# Patient Record
Sex: Male | Born: 1970 | Race: Black or African American | Hispanic: No | Smoking: Current every day smoker
Health system: Southern US, Community
[De-identification: ages and names within clinical notes are randomized; demographics above are authoritative.]

## PROBLEM LIST (undated history)

## (undated) HISTORY — PX: FINGER SURGERY: SHX640

---

## 2012-07-18 ENCOUNTER — Encounter (HOSPITAL_COMMUNITY): Payer: Self-pay | Admitting: Emergency Medicine

## 2012-07-18 ENCOUNTER — Emergency Department (HOSPITAL_COMMUNITY)
Admission: EM | Admit: 2012-07-18 | Discharge: 2012-07-18 | Disposition: A | Discharge status: Home / Self Care | Payer: Self-pay | Attending: Emergency Medicine | Admitting: Emergency Medicine

## 2012-07-18 DIAGNOSIS — F17200 Nicotine dependence, unspecified, uncomplicated: Secondary | ICD-10-CM | POA: Insufficient documentation

## 2012-07-18 DIAGNOSIS — L0231 Cutaneous abscess of buttock: Secondary | ICD-10-CM | POA: Insufficient documentation

## 2012-07-18 DIAGNOSIS — L03317 Cellulitis of buttock: Secondary | ICD-10-CM | POA: Insufficient documentation

## 2012-07-18 MED ORDER — HYDROCODONE-ACETAMINOPHEN 5-325 MG PO TABS: 1.0000 | ORAL_TABLET | Freq: Four times a day (QID) | ORAL | Status: DC | PRN

## 2012-07-18 MED ORDER — CLINDAMYCIN HCL 150 MG PO CAPS: 300.0000 mg | ORAL_CAPSULE | Freq: Three times a day (TID) | ORAL | Status: DC

## 2012-07-18 NOTE — ED Notes (Signed)
Patient complains of L butt cheek boil.  Patient advised it has been draining off and on.

## 2012-07-18 NOTE — ED Notes (Signed)
Abscess L butt cheek.

## 2012-07-18 NOTE — ED Provider Notes (Signed)
History     CSN: [id number]  Arrival date & time 07/18/12  1046   First MD Initiated Contact with Patient 07/18/12 1059      Chief Complaint  Patient presents with  . Abscess    L butt cheek    (Consider location/radiation/quality/duration/timing/severity/associated sxs/prior treatment) HPI Mitchell Mccarty is a 42 y.o. male who presents to ED with complaint of an abscess to the left buttock. States started about 3 days ago. Draining "some." tried warm compresses and baths with no relief. States hx of the same in the same location. No fever, chills, malaise. No other complaints.  History reviewed. No pertinent past medical history.  History reviewed. No pertinent past surgical history.  No family history on file.  History  Substance Use Topics  . Smoking status: Current Every Day Smoker -- 1.00 packs/day    Types: Cigarettes  . Smokeless tobacco: Not on file  . Alcohol Use: Yes      Review of Systems  Constitutional: Negative for fever and chills.  Skin: Positive for wound.    Allergies  Codeine and Other  Home Medications   Current Outpatient Rx  Name  Route  Sig  Dispense  Refill  . ibuprofen (ADVIL,MOTRIN) 200 MG tablet   Oral   Take 800 mg by mouth every 6 (six) hours as needed for pain.           BP 131/87  Pulse 71  Temp(Src) 97.5 F (36.4 C) (Oral)  Resp 20  Ht 5\' 9"  (1.753 m)  Wt 210 lb (95.255 kg)  BMI 31 kg/m2  SpO2 95%  Physical Exam  Nursing note and vitals reviewed. Constitutional: He appears well-developed and well-nourished. No distress.  Cardiovascular: Normal rate, regular rhythm and normal heart sounds.   Pulmonary/Chest: Effort normal and breath sounds normal. No respiratory distress. He has no wheezes. He has no rales.  Skin: Skin is warm and dry.  3x3 cm abscess to the left buttock just lateral to the rectum. Tender. No drainage at present.     ED Course  Procedures (including critical care time)  INCISION AND  DRAINAGE Performed by: Jaynie Crumble A Consent: Verbal consent obtained. Risks and benefits: risks, benefits and alternatives were discussed Type: abscess  Body area: left buttock  Anesthesia: local infiltration  Incision was made with a scalpel.  Local anesthetic: lidocaine 2% w/ epinephrine  Anesthetic total: 5 ml  Complexity: complex Blunt dissection to break up loculations  Drainage: purulent  Drainage amount: large  Packing material: 1/4 in iodoform gauze  Patient tolerance: Patient tolerated the procedure well with no immediate complications.     1. Abscess of left buttock       MDM  Abscess I&Ded, packed. Will start on antibiotics, and follow up in 2 days for recheck. Discussed with pt other possibilities of recurrent abscess in the same location so close to the rectum. Will have pt follow up with general surgery if recurs. Pt otherwise afebrile. Non toxic.   Filed Vitals:   07/18/12 1105  BP: 131/87  Pulse: 71  Temp: 97.5 F (36.4 C)  Resp: 20               Tatyana A Kirichenko, PA-C 07/18/12 1623

## 2012-07-20 ENCOUNTER — Emergency Department (HOSPITAL_COMMUNITY)
Admission: EM | Admit: 2012-07-20 | Discharge: 2012-07-20 | Disposition: A | Discharge status: Home / Self Care | Payer: Self-pay | Attending: Emergency Medicine | Admitting: Emergency Medicine

## 2012-07-20 ENCOUNTER — Encounter (HOSPITAL_COMMUNITY): Payer: Self-pay | Admitting: Nurse Practitioner

## 2012-07-20 DIAGNOSIS — L03317 Cellulitis of buttock: Secondary | ICD-10-CM | POA: Insufficient documentation

## 2012-07-20 DIAGNOSIS — L0231 Cutaneous abscess of buttock: Secondary | ICD-10-CM | POA: Insufficient documentation

## 2012-07-20 DIAGNOSIS — Z4800 Encounter for change or removal of nonsurgical wound dressing: Secondary | ICD-10-CM | POA: Insufficient documentation

## 2012-07-20 DIAGNOSIS — F17200 Nicotine dependence, unspecified, uncomplicated: Secondary | ICD-10-CM | POA: Insufficient documentation

## 2012-07-20 NOTE — ED Provider Notes (Signed)
History    This chart was scribed for non-physician practitioner working with Glynn Octave, MD by Frederik Pear, ED Scribe. This patient was seen in room TR10C/TR10C and the patient's care was started at 1632.   CSN: [id number]  Arrival date & time 07/20/12  1613   First MD Initiated Contact with Patient 07/20/12 1632      Chief Complaint  Patient presents with  . Follow-up    (Consider location/radiation/quality/duration/timing/severity/associated sxs/prior treatment) Patient is a 42 y.o. male presenting with abscess. The history is provided by the patient. No language interpreter was used.  Abscess Location:  Ano-genital Ano-genital abscess location:  R buttock and L buttock Abscess quality: draining   Red streaking: no   Chronicity:  Recurrent Associated symptoms: no fatigue, no fever, no headaches, no nausea and no vomiting   Risk factors: prior abscess     Mitchell Mccarty is a 42 y.o. male who presents to the Emergency Department complaining of a follow up to remove the packing from and I&D performed 2 days ago for an abscess to the right buttock. The abscess as has been recurrent in the past, but he has never seen a Development worker, international aid.  He denies any nocturnal hidrosis, fever, chills, nausea, or emesis.  Pt has been taking his antibiotics as directed.    History reviewed. No pertinent past medical history.  Past Surgical History  Procedure Laterality Date  . Finger surgery      No family history on file.  History  Substance Use Topics  . Smoking status: Current Every Day Smoker -- 1.00 packs/day    Types: Cigarettes  . Smokeless tobacco: Not on file  . Alcohol Use: Yes      Review of Systems  Constitutional: Negative for fever, diaphoresis, appetite change, fatigue and unexpected weight change.  HENT: Negative for mouth sores and neck stiffness.   Eyes: Negative for visual disturbance.  Respiratory: Negative for cough, chest tightness, shortness of breath and  wheezing.   Cardiovascular: Negative for chest pain.  Gastrointestinal: Negative for nausea, vomiting, abdominal pain, diarrhea and constipation.  Endocrine: Negative for polydipsia, polyphagia and polyuria.  Genitourinary: Negative for dysuria, urgency, frequency and hematuria.  Musculoskeletal: Negative for back pain.  Skin: Negative for rash.       Abscess  Allergic/Immunologic: Negative for immunocompromised state.  Neurological: Negative for syncope, light-headedness and headaches.  Hematological: Does not bruise/bleed easily.  Psychiatric/Behavioral: Negative for sleep disturbance. The patient is not nervous/anxious.   All other systems reviewed and are negative.    Allergies  Codeine and Other  Home Medications   Current Outpatient Rx  Name  Route  Sig  Dispense  Refill  . ibuprofen (ADVIL,MOTRIN) 200 MG tablet   Oral   Take 800 mg by mouth every 6 (six) hours as needed for pain.           BP 122/82  Pulse 64  Temp(Src) 98.3 F (36.8 C) (Oral)  Resp 16  SpO2 97%  Physical Exam  Nursing note and vitals reviewed. Constitutional: He is oriented to person, place, and time. He appears well-developed and well-nourished. No distress.  HENT:  Head: Normocephalic and atraumatic.  Eyes: Conjunctivae are normal. No scleral icterus.  Neck: Normal range of motion.  Cardiovascular: Normal rate, regular rhythm and intact distal pulses.   Pulmonary/Chest: Effort normal and breath sounds normal.  Abdominal: Soft. He exhibits no distension. There is no tenderness.  Genitourinary:  Left buttock inside the gluteal cleft there is a 1  cm x1 cm erythematous site with obvious I&D location with packing in place. No palpable induration. Moderate purulent drainage.   Lymphadenopathy:    He has no cervical adenopathy.  Neurological: He is alert and oriented to person, place, and time.  Skin: Skin is warm and dry. He is not diaphoretic. There is erythema.  Psychiatric: He has a normal  mood and affect.    ED Course  Procedures (including critical care time)  DIAGNOSTIC STUDIES: Oxygen Saturation is 97% on room air, adequate by my interpretation.    COORDINATION OF CARE:  17:05- Discussed planned course of treatment with the patient, including warm water soaks, who is agreeable at this time.  Labs Reviewed - No data to display No results found.   1. Abscess packing removal       MDM  Mitchell Mccarty presents for wound check and packing removal.  Pt with very small amount of packing.  Wound appears to be healing as compared to documented last note.  Pt given wound care instructions and general surgery follow-up as needed.  I have also discussed reasons to return immediately to the ER.  Patient expresses understanding and agrees with plan.   1. Medications: usual home medications 2. Treatment: rest, drink plenty of fluids, warm sitz bath for continued wound drainage 3. Follow Up: Please followup with your primary doctor for discussion of your diagnoses and further evaluation after today's visit; if you do not have a primary care doctor use the resource guide provided to find one; follow-up with general surgery as needed  I personally performed the services described in this documentation, which was scribed in my presence. The recorded information has been reviewed and is accurate.       Dahlia Client Muthersbaugh, PA-C 07/20/12 1722

## 2012-07-20 NOTE — ED Provider Notes (Signed)
Medical screening examination/treatment/procedure(s) were performed by non-physician practitioner and as supervising physician I was immediately available for consultation/collaboration.    Stephen Rancour, MD 07/20/12 2206 

## 2012-07-20 NOTE — ED Notes (Signed)
Pt seen here Wednesday for abscess drainage. Follow up for packing removal today

## 2012-07-23 NOTE — ED Provider Notes (Signed)
Medical screening examination/treatment/procedure(s) were performed by non-physician practitioner and as supervising physician I was immediately available for consultation/collaboration.   Demetres Prochnow E Yussef Jorge, MD 07/23/12 0740 

## 2012-09-20 ENCOUNTER — Emergency Department (HOSPITAL_COMMUNITY)
Admission: EM | Admit: 2012-09-20 | Discharge: 2012-09-20 | Disposition: A | Discharge status: Home / Self Care | Payer: Self-pay | Attending: Emergency Medicine | Admitting: Emergency Medicine

## 2012-09-20 ENCOUNTER — Emergency Department (HOSPITAL_COMMUNITY): Payer: Self-pay

## 2012-09-20 ENCOUNTER — Encounter (HOSPITAL_COMMUNITY): Payer: Self-pay | Admitting: Nurse Practitioner

## 2012-09-20 DIAGNOSIS — Y9383 Activity, rough housing and horseplay: Secondary | ICD-10-CM | POA: Insufficient documentation

## 2012-09-20 DIAGNOSIS — X500XXA Overexertion from strenuous movement or load, initial encounter: Secondary | ICD-10-CM | POA: Insufficient documentation

## 2012-09-20 DIAGNOSIS — S298XXA Other specified injuries of thorax, initial encounter: Secondary | ICD-10-CM | POA: Insufficient documentation

## 2012-09-20 DIAGNOSIS — Y9289 Other specified places as the place of occurrence of the external cause: Secondary | ICD-10-CM | POA: Insufficient documentation

## 2012-09-20 DIAGNOSIS — R0781 Pleurodynia: Secondary | ICD-10-CM

## 2012-09-20 DIAGNOSIS — F17200 Nicotine dependence, unspecified, uncomplicated: Secondary | ICD-10-CM | POA: Insufficient documentation

## 2012-09-20 MED ORDER — IBUPROFEN 800 MG PO TABS: 800.0000 mg | ORAL_TABLET | Freq: Three times a day (TID) | ORAL | Status: AC

## 2012-09-20 MED ORDER — HYDROCODONE-ACETAMINOPHEN 5-325 MG PO TABS: ORAL_TABLET | ORAL | Status: AC

## 2012-09-20 NOTE — ED Notes (Signed)
States he was horseplaying last week and has been having R sided rib pain since. Denies SOB but it hurts worse to breathe. A&Ox4, resp e/u

## 2012-09-20 NOTE — ED Provider Notes (Signed)
History    This chart was scribed for non-physician practitioner [name] working with Mitchell Munch, MD by Quintella Reichert, ED Scribe. This patient was seen in room TR05C/TR05C and the patient's care was started at 4:38 PM .   CSN: [id number]  Arrival date & time 09/20/12  1349      Chief Complaint  Patient presents with  . Rib Injury     The history is provided by the patient. No language interpreter was used.   HPI Comments: Mitchell Mccarty is a 42 y.o. male who presents to the Emergency Department complaining of constant left rib pain subsequent to an injury sustained when pt was wrestling 1 week ago.  Pt reports he does not remember exactly how the injury occurred.  He rates severity of pain as 10/10 at its worst, and states it is exacerbated by deep inspiration and by movement, especially by tilting his torso to the right.  Pt denies visible bruising to the area.  He denies pain or injury in any other area.  An X-ray taken on admission was negative for fractures.  Pt has been treating pain with ice and heat.  Pt denies fever, chills, CP, cough, SOB, abdominal pain, nausea, emesis, diarrhea, urinary symptoms, back pain, HA, weakness, numbness or any other associated symptoms.  He has no h/o asthma or any other medical problems.  Pt denies regular heavy physical activity.  Pt has no PCP. Pt is allergic to codeine.    No past medical history on file.  Past Surgical History  Procedure Laterality Date  . Finger surgery      No family history on file.  History  Substance Use Topics  . Smoking status: Current Every Day Smoker -- 1.00 packs/day    Types: Cigarettes  . Smokeless tobacco: Not on file  . Alcohol Use: Yes      Review of Systems  Constitutional: Negative for fever and chills.  Respiratory: Negative for cough and shortness of breath.   Cardiovascular: Negative for chest pain.  Gastrointestinal: Negative for nausea, vomiting, abdominal pain and  diarrhea.  Genitourinary: Negative for dysuria and difficulty urinating.  Musculoskeletal: Negative for back pain.  Neurological: Negative for weakness, numbness and headaches.  All other systems reviewed and are negative.    Allergies  Codeine and Other  Home Medications   Current Outpatient Rx  Name  Route  Sig  Dispense  Refill  . HYDROcodone-acetaminophen (NORCO/VICODIN) 5-325 MG per tablet      Take 1-2 tablets every 6 hours as needed for pain   6 tablet   0   . ibuprofen (ADVIL,MOTRIN) 800 MG tablet   Oral   Take 1 tablet (800 mg total) by mouth 3 (three) times daily.   21 tablet   0     BP 142/75  Pulse 83  Temp(Src) 98.3 F (36.8 C) (Oral)  Resp 18  SpO2 97%  Physical Exam  Nursing note and vitals reviewed. Constitutional: He is oriented to person, place, and time. He appears well-developed and well-nourished. No distress.  HENT:  Head: Normocephalic and atraumatic.  Eyes: EOM are normal.  Neck: Normal range of motion. Neck supple. No tracheal deviation present.  Cardiovascular: Normal rate and regular rhythm.   Pulmonary/Chest: Effort normal and breath sounds normal. No respiratory distress. He has no wheezes. He has no rales. He exhibits tenderness (Right side).  Abdominal: Soft. He exhibits no distension. There is no tenderness.  Musculoskeletal: Normal range of motion. He exhibits tenderness (Right  chest wall).  Right chest wall without tenderness, crepitus, or flail chest.  Neurological: He is alert and oriented to person, place, and time.  Skin: Skin is warm and dry.  Intact, no ecchymosis  Psychiatric: He has a normal mood and affect. His behavior is normal.    ED Course  Procedures (including critical care time)  DIAGNOSTIC STUDIES: Oxygen Saturation is 97% on room air, normal by my interpretation.    COORDINATION OF CARE: 4:42 PM-Discussed treatment plan with pt at bedside, which includes pain medication, rest, and f/u with PCP if symptoms  do not begin to improve within 2 weeks, or if new symptoms develop. Pt agreed to plan.      Labs Reviewed - No data to display Dg Chest 2 View  09/20/2012  *RADIOLOGY REPORT*  Clinical Data: Chest pain.  CHEST - 2 VIEW  Comparison:  None.  Findings:  The heart size and mediastinal contours are within normal limits.  Both lungs are clear.  The visualized skeletal structures are unremarkable.  IMPRESSION: No active cardiopulmonary disease.   Original Report Authenticated By: Richarda Overlie, M.D.      1. Rib pain on right side       MDM  Pt c/o right sided rib pain after "horseplaying" last week.  States pain has gotten a little better but still hurts with deep breathing and laying on right side.  Denies hitting head or other injuries at this time.  Plain film: no visible fx.  Advised pt it is possible he might have a tiny fx not visible at this time or he could have simply bruised his rib.  Explained that pain control is tx goal at this time along with encouraging him to breath normally.  Try not to take shallow breaths.   Rx: norco and ibuprofen. Gave contact info for [employer] to f/u with PCP if symptoms do not begin to improve within 2 weeks or if develop cough with fever.   I personally performed the services described in this documentation, which was scribed in my presence. The recorded information has been reviewed and is accurate.      [name], PA-C 09/21/12 1717

## 2012-09-21 NOTE — ED Provider Notes (Signed)
.  Sheran Fava, MD 09/21/12 [phone]

## 2014-03-28 IMAGING — CR DG CHEST 2V
2 series · 2 of 2 positions shown · non-contrast
Comparison: None.

CLINICAL DATA: Chest pain.

CHEST - 2 VIEW

[w chest pa]
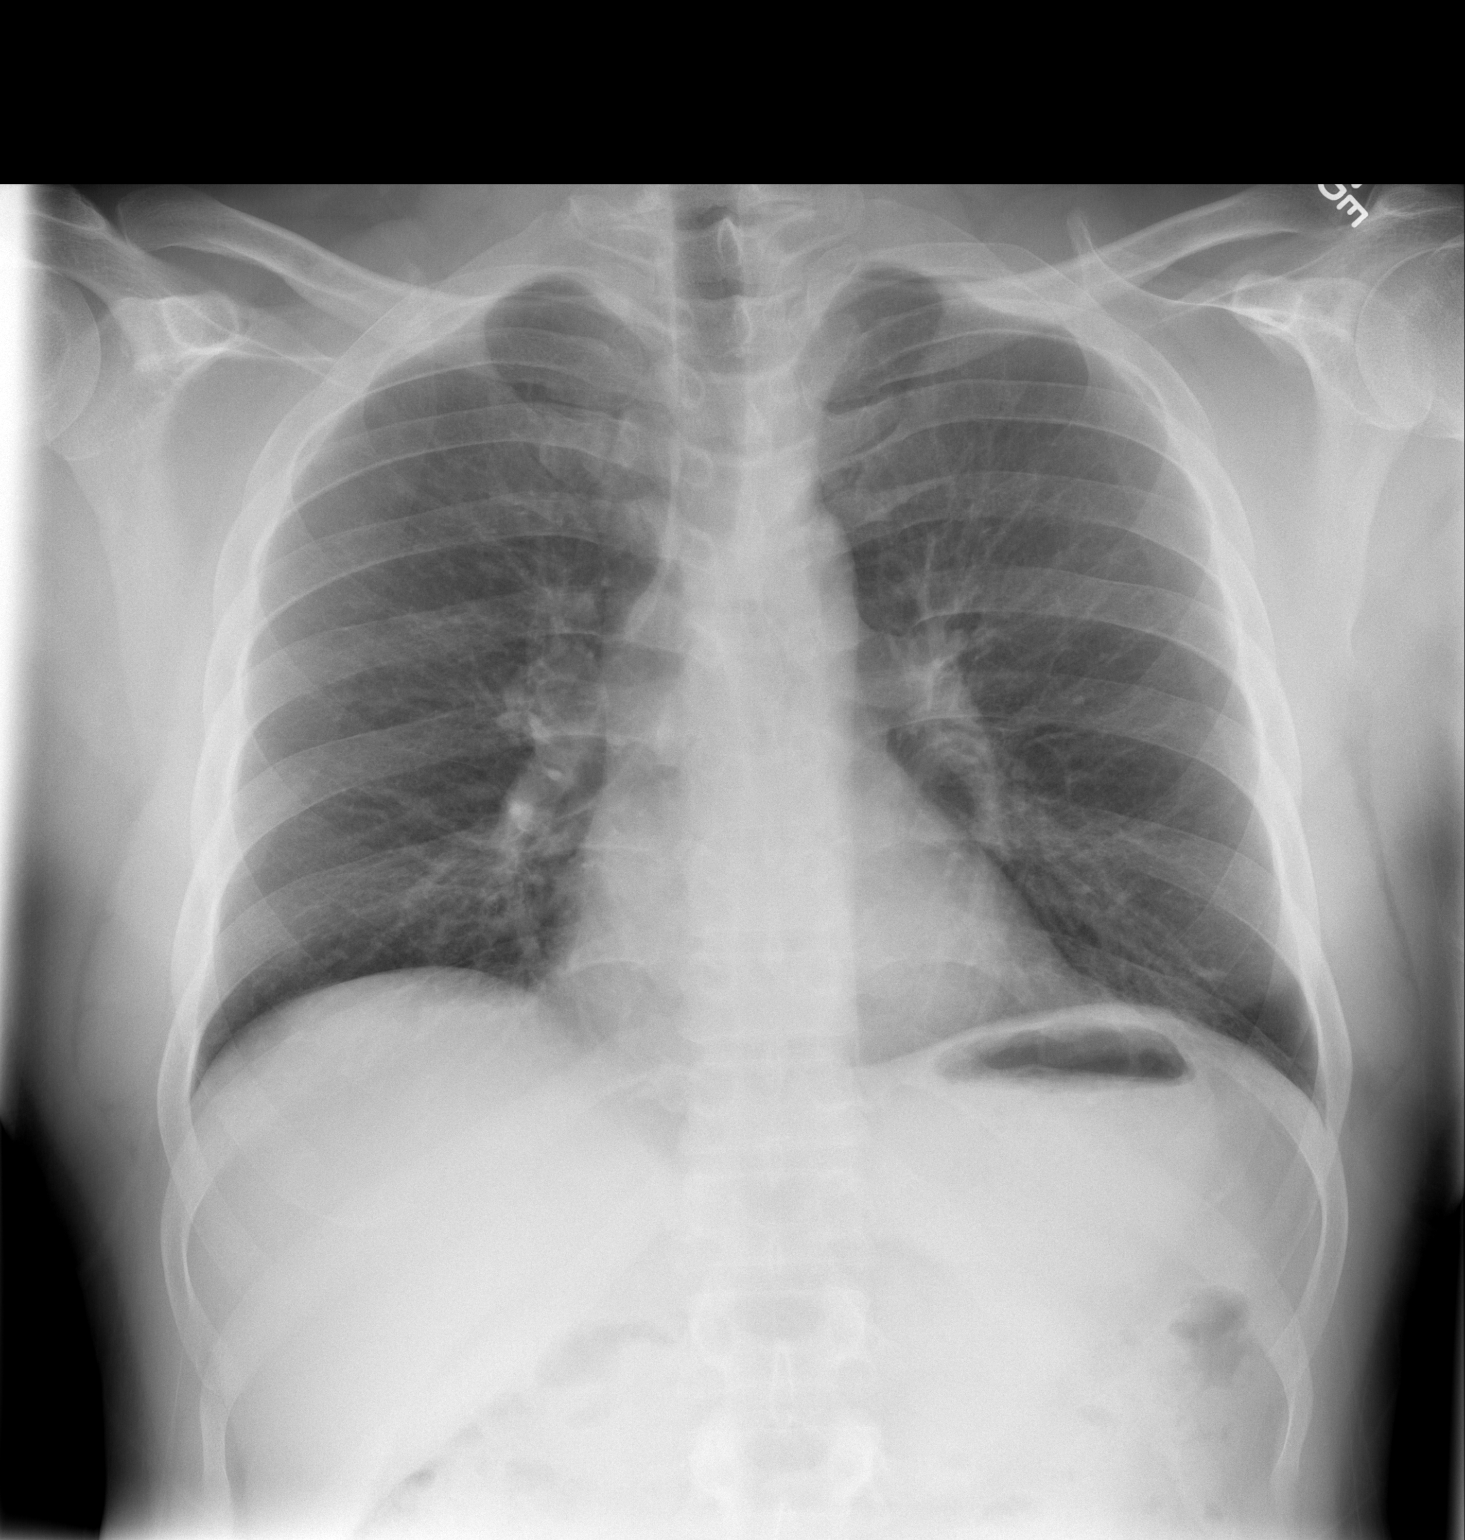

[w chest lat]
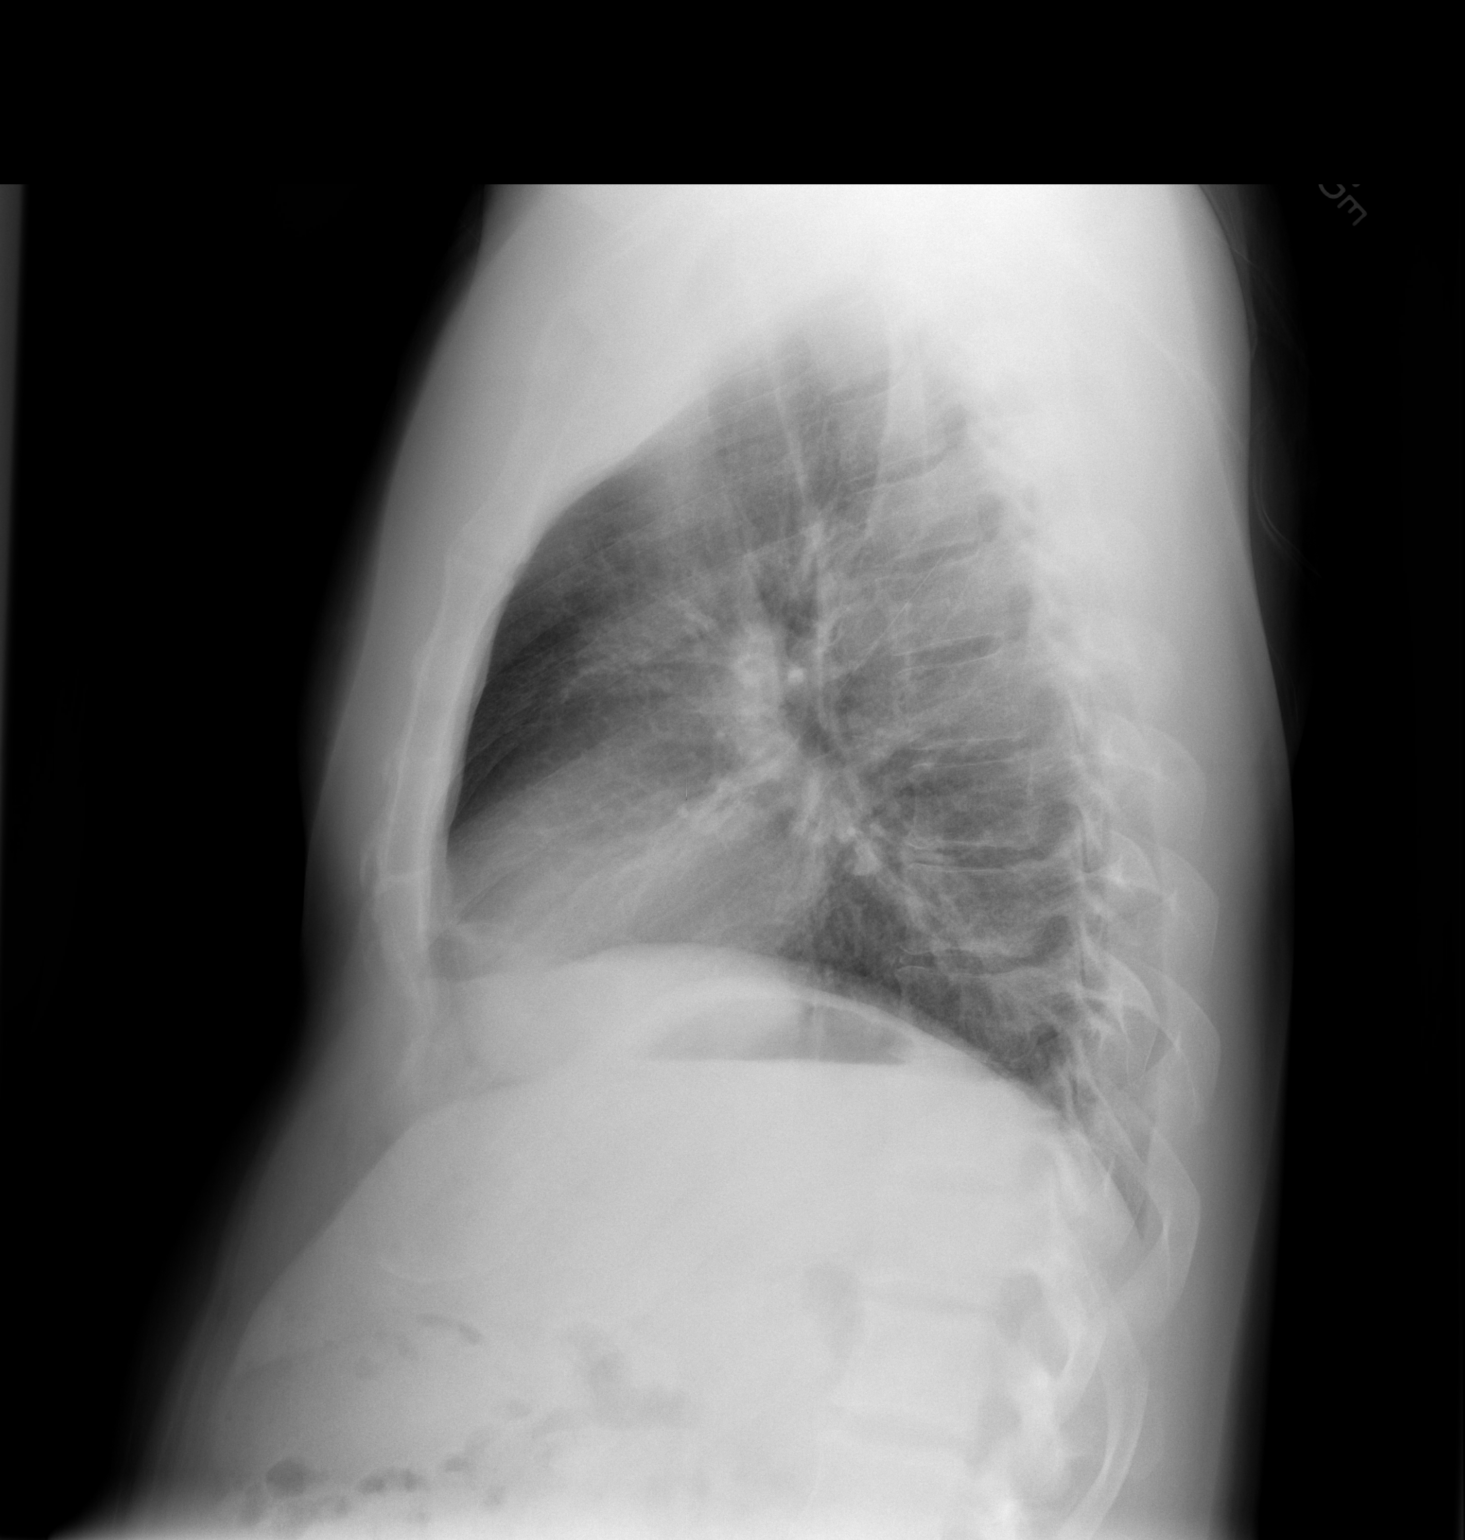

[2 of 2 positions shown; findings below may reference images not displayed]

FINDINGS: The heart size and mediastinal contours are within
normal limits.  Both lungs are clear.  The visualized skeletal
structures are unremarkable.
IMPRESSION: No active cardiopulmonary disease.

## 2016-12-04 ENCOUNTER — Emergency Department (HOSPITAL_COMMUNITY)
Admission: EM | Admit: 2016-12-04 | Discharge: 2016-12-04 | Disposition: A | Discharge status: Home / Self Care | Payer: BLUE CROSS/BLUE SHIELD | Attending: Emergency Medicine | Admitting: Emergency Medicine

## 2016-12-04 ENCOUNTER — Encounter (HOSPITAL_COMMUNITY): Payer: Self-pay | Admitting: Emergency Medicine

## 2016-12-04 ENCOUNTER — Emergency Department (HOSPITAL_COMMUNITY): Payer: BLUE CROSS/BLUE SHIELD

## 2016-12-04 DIAGNOSIS — Z79899 Other long term (current) drug therapy: Secondary | ICD-10-CM | POA: Insufficient documentation

## 2016-12-04 DIAGNOSIS — M25561 Pain in right knee: Secondary | ICD-10-CM | POA: Diagnosis present

## 2016-12-04 DIAGNOSIS — Z791 Long term (current) use of non-steroidal anti-inflammatories (NSAID): Secondary | ICD-10-CM | POA: Diagnosis not present

## 2016-12-04 DIAGNOSIS — F17210 Nicotine dependence, cigarettes, uncomplicated: Secondary | ICD-10-CM | POA: Insufficient documentation

## 2016-12-04 MED ORDER — MELOXICAM 15 MG PO TABS: 15.0000 mg | ORAL_TABLET | Freq: Every day | ORAL | 0 refills | Status: AC

## 2016-12-04 NOTE — ED Provider Notes (Signed)
[facility]-EMERGENCY DEPT Provider Note   CSN: [id number] Arrival date & time: 12/04/16  0907  By signing my name below, I, Rosario AdieWilliam Andrew Hiatt, attest that this documentation has been prepared under the direction and in the presence of Nabor Thomann, PA-C.  Electronically Signed: Rosario AdieWilliam Andrew Hiatt, ED Scribe. 12/04/16. 9:52 AM.  History   Chief Complaint Chief Complaint  Patient presents with  . Knee Pain   The history is provided by the patient. No language interpreter was used.   HPI Comments: Mitchell Mccarty is a 46 y.o. male with no pertinent PMHx, who presents to the Emergency Department complaining of persistent, gradually worsening right knee pain with associated swelling beginning approximately two weeks ago. Pt reports that he woke up with his pain and swelling. No recent injury or h/o trauma to the knee. 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 His pain is worse with ambulation and weight bearing. No reported treatments for his pain were tried prior to coming into the ED. No h/o gout or arthritis. He denies fever, chills, or any other associated symptom.   History reviewed. No pertinent past medical history.  There are no active problems to display for this patient.  Past Surgical History:  Procedure Laterality Date  . FINGER SURGERY      Home Medications    Prior to Admission medications   Medication Sig Start Date End Date Taking? Authorizing Provider  HYDROcodone-acetaminophen (NORCO/VICODIN) 5-325 MG per tablet Take 1-2 tablets every 6 hours as needed for pain 09/20/12   Lurene ShadowPhelps, Erin O, PA-C  ibuprofen (ADVIL,MOTRIN) 800 MG tablet Take 1 tablet (800 mg total) by mouth 3 (three) times daily. 09/20/12   Lurene ShadowPhelps, Erin O, PA-C   Family History History reviewed. No pertinent family history.  Social History Social History  Substance Use Topics  . Smoking status: Current Every Day Smoker    Packs/day: 1.00    Types: Cigarettes  . Smokeless tobacco: Not on file  . Alcohol use Yes   Allergies   Codeine and Other  Review of Systems Review of Systems  Constitutional: Negative for chills and fever.  Musculoskeletal: Positive for arthralgias, joint swelling and myalgias.  Skin: Negative for color change.  Neurological: Negative for weakness and numbness.  All other systems reviewed and are negative.  Physical Exam Updated Vital Signs BP (!) 133/99 (BP Location: Right Arm)   Pulse 63   Temp 98.3 F (36.8 C) (Oral)   Resp 18   SpO2 97%   Physical Exam  Constitutional: He is oriented to person, place, and time. He appears well-developed and well-nourished. No distress.  HENT:  Head: Normocephalic and atraumatic.  Eyes: Conjunctivae are normal.  Neck: Normal range of motion.  Cardiovascular: Normal rate.   Pulmonary/Chest: Effort normal.  Abdominal: He exhibits no distension.  Musculoskeletal: Normal range of motion.  Swelling noted to the right knee, with moderate joint effusion based on exam. No erythema or skin discoloration over the joint. Joint does not feel warm. Full range of motion of the knee with pain with full flexion. Negative anterior posterior drawer signs. The laxity of medial lateral stress. TTP over medial joint  Neurological: He is alert and oriented to person, place, and time.  Skin: No pallor.  Psychiatric: He has a normal mood and affect. His behavior is normal.  Nursing note and vitals reviewed.  ED Treatments / Results  DIAGNOSTIC STUDIES: Oxygen Saturation is 97% on  RA, normal by my interpretation.   COORDINATION OF CARE: 9:52 AM-Discussed next steps with pt. Pt verbalized understanding and is agreeable with the plan.   Labs (all labs ordered are listed, but only abnormal results are displayed) Labs Reviewed - No data to display  EKG  EKG Interpretation None      Radiology Dg Knee Complete 4 Views Right  Result Date: 12/04/2016 CLINICAL DATA:   46 year old male with generalized right knee pain for the past 2 weeks EXAM: RIGHT KNEE - COMPLETE 4+ VIEW COMPARISON:  None. FINDINGS: No evidence of fracture, dislocation, or joint effusion. No evidence of arthropathy or other focal bone abnormality. Soft tissues are unremarkable. IMPRESSION: Negative. Electronically Signed   By: Malachy Moan M.D.   On: 12/04/2016 09:37   Procedures Procedures   Medications Ordered in ED Medications - No data to display  Initial Impression / Assessment and Plan / ED Course  I have reviewed the triage vital signs and the nursing notes.  Pertinent labs & imaging results that were available during my care of the patient were reviewed by me and considered in my medical decision making (see chart for details).     Patient in emergency department with atraumatic right knee swelling. No signs of joint infection based on exam. Full range of motion of the joints. No injuries. X-ray negative. Question meniscus injury versus osteoarthritis inflammation. We'll chewed with ice, elevation, compression, NSAIDs. Follow-up with orthopedics will be provided. Return precautions discussed.  Vitals:   12/04/16 0917  BP: (!) 133/99  Pulse: 63  Resp: 18  Temp: 98.3 F (36.8 C)  TempSrc: Oral  SpO2: 97%     Final Clinical Impressions(s) / ED Diagnoses   Final diagnoses:  Acute pain of right knee   New Prescriptions Discharge Medication List as of 12/04/2016 10:04 AM    START taking these medications   Details  meloxicam (MOBIC) 15 MG tablet Take 1 tablet (15 mg total) by mouth daily., Starting Sun 12/04/2016, Print       I personally performed the services described in this documentation, which was scribed in my presence. The recorded information has been reviewed and is accurate.     Jaynie Crumble, PA-C 12/04/16 1055    Arby Barrette, MD 12/04/16 [phone]

## 2016-12-04 NOTE — ED Notes (Signed)
[facility]

## 2016-12-04 NOTE — ED Notes (Signed)
Patient in Xray

## 2016-12-04 NOTE — Discharge Instructions (Signed)
Ice, elevate, knee sleeve for compression. Mobic for inflammation as prescribed. Follow up with family doctor or orthopedics if not improving.

## 2016-12-04 NOTE — ED Triage Notes (Signed)
Pt sts right knee pain with swelling x 2 weeks

## 2017-01-02 ENCOUNTER — Encounter (HOSPITAL_COMMUNITY): Payer: Self-pay | Admitting: Emergency Medicine

## 2017-01-02 ENCOUNTER — Emergency Department (HOSPITAL_COMMUNITY)
Admission: EM | Admit: 2017-01-02 | Discharge: 2017-01-02 | Discharge status: Against Medical Advice | Payer: BLUE CROSS/BLUE SHIELD | Attending: Emergency Medicine | Admitting: Emergency Medicine

## 2017-01-02 DIAGNOSIS — Z5321 Procedure and treatment not carried out due to patient leaving prior to being seen by health care provider: Secondary | ICD-10-CM | POA: Diagnosis not present

## 2017-01-02 DIAGNOSIS — M25561 Pain in right knee: Secondary | ICD-10-CM | POA: Insufficient documentation

## 2017-01-02 NOTE — ED Notes (Signed)
Pt left per [employer].

## 2017-01-02 NOTE — ED Triage Notes (Signed)
Pt reports R knee pain, has been seen already and told to follow up with ortho which he has not. Pt is ambulatory. Steady gait. Pt more concerned about playing game on phone in triage than speaking to this RN.

## 2018-06-11 IMAGING — DX DG KNEE COMPLETE 4+V*R*
4 series · 4 of 4 positions shown · non-contrast
Comparison: None.

CLINICAL DATA: 46-year-old male with generalized right knee pain
for the past 2 weeks

EXAM:
RIGHT KNEE - COMPLETE 4+ VIEW

[knee ap]
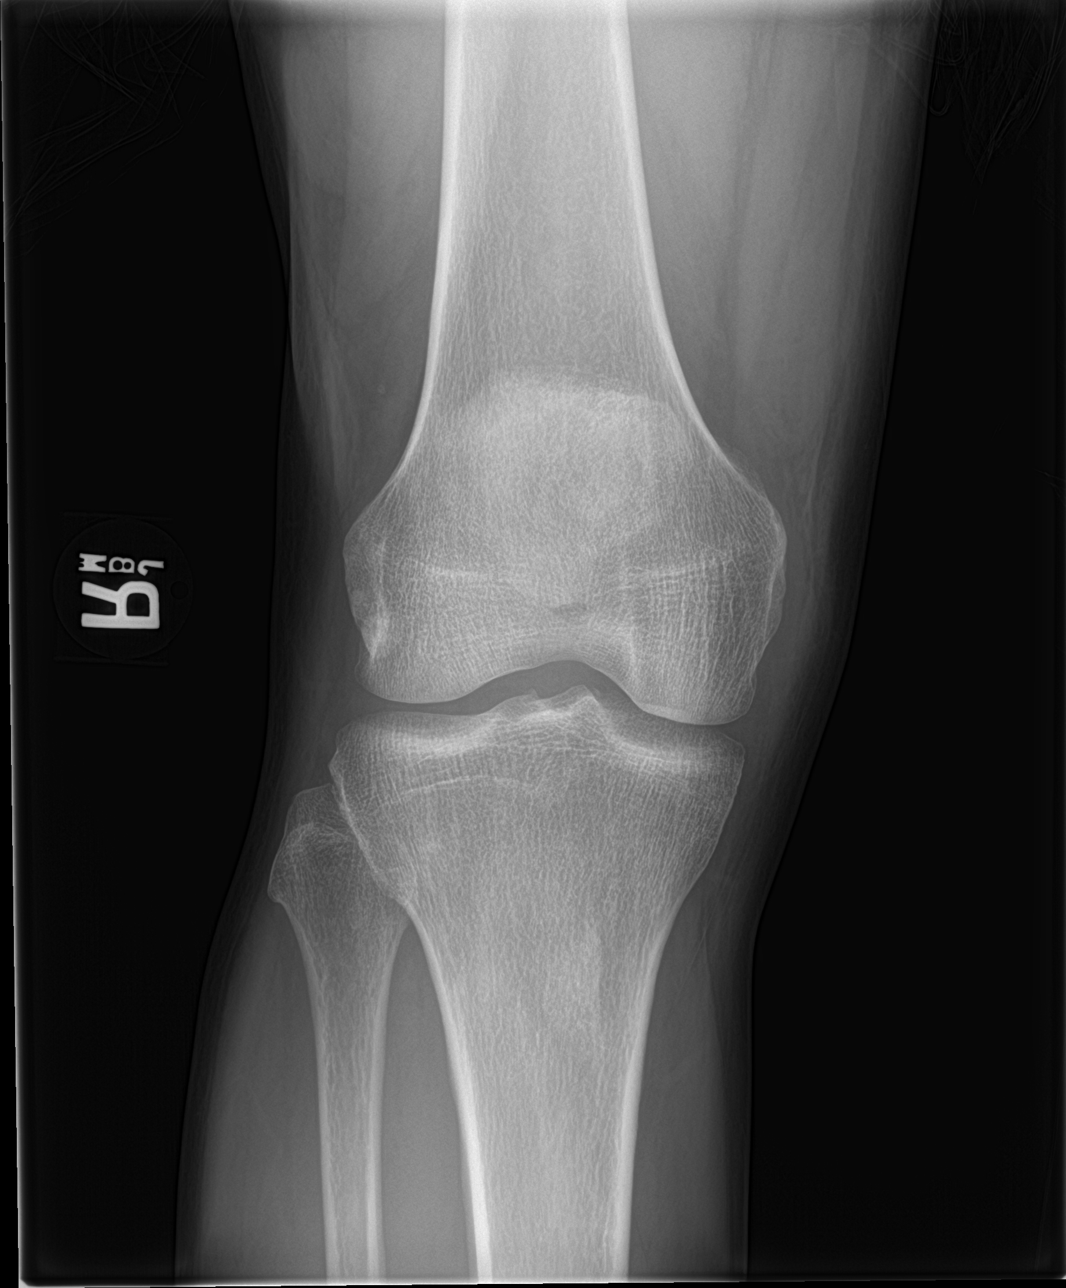

[tunnel]
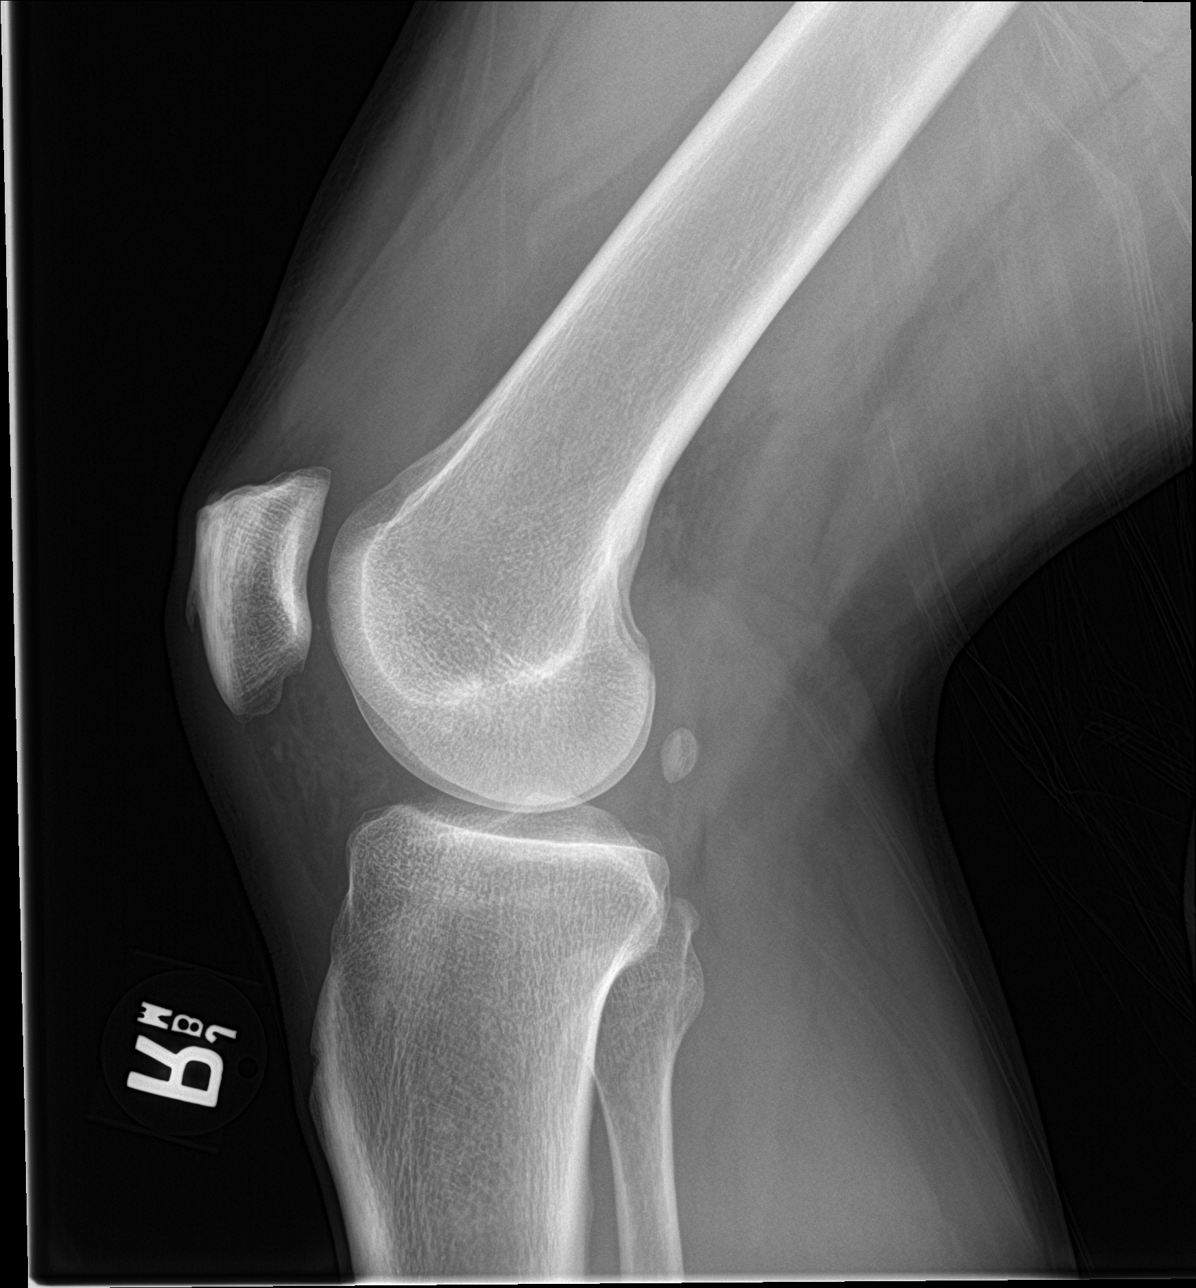

[knee obl (1 of 2)]
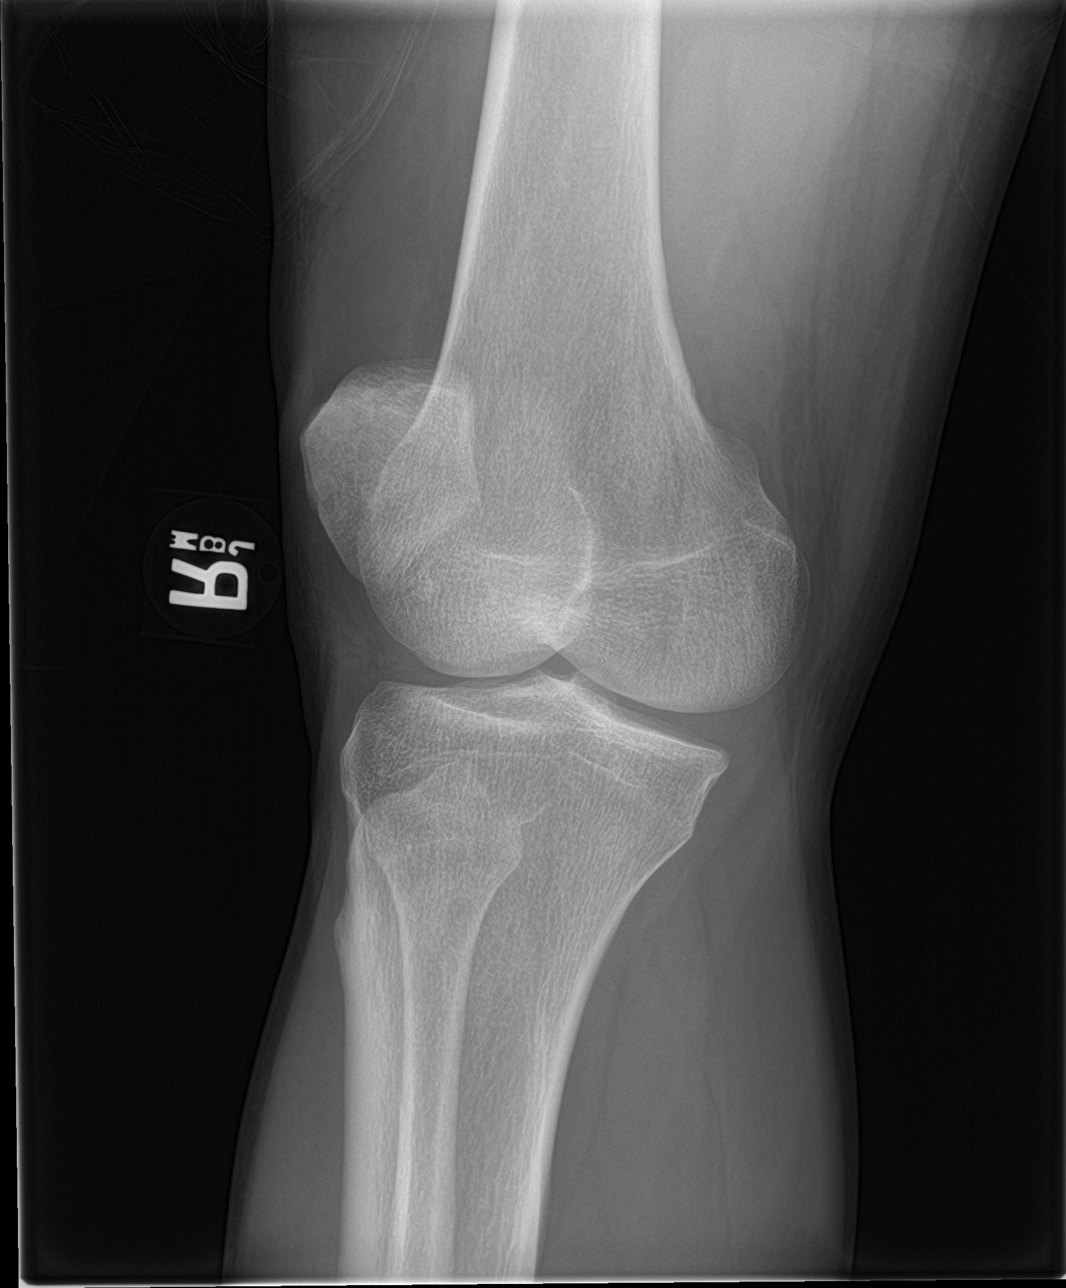

[knee obl (2 of 2)]
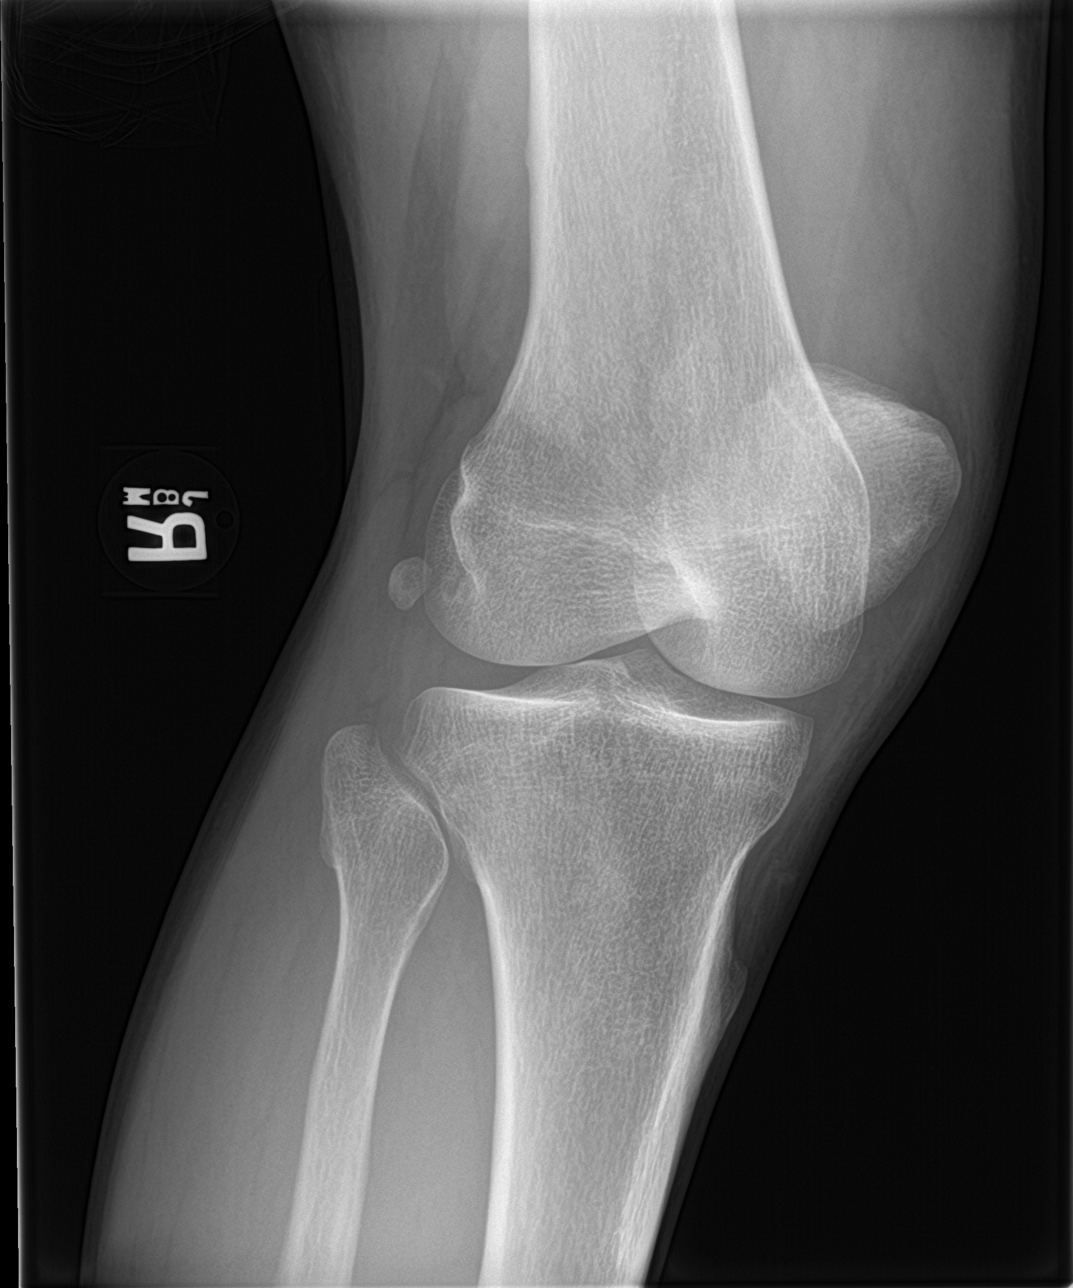

[4 of 4 positions shown; findings below may reference images not displayed]

FINDINGS: No evidence of fracture, dislocation, or joint effusion. No evidence
of arthropathy or other focal bone abnormality. Soft tissues are
unremarkable.
IMPRESSION: Negative.
